# Patient Record
Sex: Male | Born: 1990 | Race: Black or African American | Hispanic: No | Marital: Single | Smoking: Current every day smoker
Health system: Southern US, Community
[De-identification: ages and names within clinical notes are randomized; demographics above are authoritative.]

## PROBLEM LIST (undated history)

## (undated) DIAGNOSIS — J45909 Unspecified asthma, uncomplicated: Secondary | ICD-10-CM

---
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
## 2010-06-18 ENCOUNTER — Emergency Department (HOSPITAL_COMMUNITY): Admission: EM | Admit: 2010-06-18 | Discharge: 2010-06-18 | Payer: Self-pay | Admitting: Emergency Medicine

## 2010-06-26 ENCOUNTER — Emergency Department (HOSPITAL_COMMUNITY): Admission: EM | Admit: 2010-06-26 | Discharge: 2010-06-26 | Payer: Self-pay | Admitting: Family Medicine

## 2011-07-15 ENCOUNTER — Emergency Department (HOSPITAL_COMMUNITY): Payer: Medicaid Other

## 2011-07-15 ENCOUNTER — Emergency Department (HOSPITAL_COMMUNITY)
Admission: EM | Admit: 2011-07-15 | Discharge: 2011-07-15 | Disposition: A | Discharge status: Home / Self Care | Payer: Medicaid Other | Attending: Emergency Medicine | Admitting: Emergency Medicine

## 2011-07-15 DIAGNOSIS — J40 Bronchitis, not specified as acute or chronic: Secondary | ICD-10-CM | POA: Insufficient documentation

## 2011-07-15 DIAGNOSIS — R05 Cough: Secondary | ICD-10-CM | POA: Insufficient documentation

## 2011-07-15 DIAGNOSIS — R0989 Other specified symptoms and signs involving the circulatory and respiratory systems: Secondary | ICD-10-CM | POA: Insufficient documentation

## 2011-07-15 DIAGNOSIS — R1110 Vomiting, unspecified: Secondary | ICD-10-CM | POA: Insufficient documentation

## 2011-07-15 DIAGNOSIS — F17200 Nicotine dependence, unspecified, uncomplicated: Secondary | ICD-10-CM | POA: Insufficient documentation

## 2011-07-15 DIAGNOSIS — R059 Cough, unspecified: Secondary | ICD-10-CM | POA: Insufficient documentation

## 2011-07-15 DIAGNOSIS — R0609 Other forms of dyspnea: Secondary | ICD-10-CM | POA: Insufficient documentation

## 2011-07-15 DIAGNOSIS — R079 Chest pain, unspecified: Secondary | ICD-10-CM | POA: Insufficient documentation

## 2011-07-15 LAB — RAPID STREP SCREEN (MED CTR MEBANE ONLY): Streptococcus, Group A Screen (Direct): NEGATIVE

## 2014-01-31 ENCOUNTER — Encounter (HOSPITAL_COMMUNITY): Payer: Self-pay | Admitting: Emergency Medicine

## 2014-01-31 ENCOUNTER — Emergency Department (HOSPITAL_COMMUNITY)
Admission: EM | Admit: 2014-01-31 | Discharge: 2014-01-31 | Disposition: A | Discharge status: Home / Self Care | Payer: Medicaid Other | Attending: Emergency Medicine | Admitting: Emergency Medicine

## 2014-01-31 DIAGNOSIS — H6691 Otitis media, unspecified, right ear: Secondary | ICD-10-CM

## 2014-01-31 DIAGNOSIS — F17200 Nicotine dependence, unspecified, uncomplicated: Secondary | ICD-10-CM | POA: Insufficient documentation

## 2014-01-31 DIAGNOSIS — J45909 Unspecified asthma, uncomplicated: Secondary | ICD-10-CM | POA: Insufficient documentation

## 2014-01-31 DIAGNOSIS — H6690 Otitis media, unspecified, unspecified ear: Secondary | ICD-10-CM | POA: Insufficient documentation

## 2014-01-31 HISTORY — DX: Unspecified asthma, uncomplicated: J45.909

## 2014-01-31 MED ORDER — AMOXICILLIN 500 MG PO CAPS: 500.0000 mg | ORAL_CAPSULE | Freq: Three times a day (TID) | ORAL | Status: DC

## 2014-01-31 MED ORDER — AMOXICILLIN 500 MG PO CAPS
500.0000 mg | ORAL_CAPSULE | Freq: Once | ORAL | Status: AC
  Administered 2014-01-31: 500 mg via ORAL
  Filled 2014-01-31: qty 1

## 2014-01-31 MED ORDER — ACETAMINOPHEN 500 MG PO TABS: 500.0000 mg | ORAL_TABLET | Freq: Four times a day (QID) | ORAL | Status: AC | PRN

## 2014-01-31 MED ORDER — ACETAMINOPHEN 325 MG PO TABS
650.0000 mg | ORAL_TABLET | Freq: Once | ORAL | Status: AC
  Administered 2014-01-31: 650 mg via ORAL
  Filled 2014-01-31: qty 2

## 2014-01-31 NOTE — Discharge Instructions (Signed)
Take Amoxicillin as directed until gone. Take tylenol as needed for pain. Refer to attached documents for more information.

## 2014-01-31 NOTE — ED Provider Notes (Signed)
CSN: [id number]     Arrival date & time 01/31/14  1235 History   First MD Initiated Contact with Patient 01/31/14 1252     Chief Complaint  Patient presents with  . Otalgia     (Consider location/radiation/quality/duration/timing/severity/associated sxs/prior Treatment) Patient is a 23 y.o. male presenting with ear pain. The history is provided by the patient. No language interpreter was used.  Otalgia Location:  Right Behind ear:  No abnormality Quality:  Aching Severity:  Severe Onset quality:  Gradual Duration:  2 days Timing:  Constant Progression:  Unchanged Chronicity:  New Context: not direct blow, not elevation change and not foreign body in ear   Relieved by:  Nothing Worsened by:  Nothing tried Ineffective treatments:  None tried Associated symptoms: no abdominal pain, no congestion and no cough   Risk factors: no recent travel, no chronic ear infection and no prior ear surgery     Past Medical History  Diagnosis Date  . Asthma    History reviewed. No pertinent past surgical history. History reviewed. No pertinent family history. History  Substance Use Topics  . Smoking status: Current Every Day Smoker  . Smokeless tobacco: Not on file  . Alcohol Use: Yes    Review of Systems  HENT: Positive for ear pain. Negative for congestion.   Respiratory: Negative for cough.   Gastrointestinal: Negative for abdominal pain.      Allergies  Review of patient's allergies indicates no known allergies.  Home Medications  No current outpatient prescriptions on file. BP 104/69  Pulse 96  Temp(Src) 99 F (37.2 C) (Oral)  Resp 18  Ht 5\' 7"  (1.702 m)  Wt 126 lb 8 oz (57.38 kg)  BMI 19.81 kg/m2  SpO2 95% Physical Exam  Nursing note and vitals reviewed. Constitutional: He is oriented to person, place, and time. He appears well-developed and well-nourished. No distress.  HENT:  Head: Normocephalic and atraumatic.  Left Ear: External ear normal.  Mouth/Throat:  Oropharynx is clear and moist. No oropharyngeal exudate.  Right ear tenderness with retraction of auricle and palpation of mastoid. TM intact bilaterally.   Eyes: Conjunctivae and EOM are normal.  Neck: Normal range of motion.  Cardiovascular: Normal rate and regular rhythm.  Exam reveals no gallop and no friction rub.   No murmur heard. Pulmonary/Chest: Effort normal and breath sounds normal. He has no wheezes. He has no rales. He exhibits no tenderness.  Musculoskeletal: Normal range of motion.  Neurological: He is alert and oriented to person, place, and time. Coordination normal.  Speech is goal-oriented. Moves limbs without ataxia.   Skin: Skin is warm and dry.  Psychiatric: He has a normal mood and affect. His behavior is normal.    ED Course  Procedures (including critical care time) Labs Review Labs Reviewed - No data to display Imaging Review No results found.   EKG Interpretation None      MDM   Final diagnoses:  Right otitis media    1:36 PM Patient has right otitis media. Patient will have tylenol and amoxicillin here and be discharged with prescriptions of the same. Vitals stable and patient afebrile.     Emilia BeckKaitlyn Sutton Plake, PA-C 01/31/14 [phone]

## 2014-01-31 NOTE — ED Provider Notes (Signed)
Medical screening examination/treatment/procedure(s) were performed by non-physician practitioner and as supervising physician I was immediately available for consultation/collaboration.  Mahkayla Preece, MD 01/31/14 1608 

## 2014-01-31 NOTE — ED Notes (Signed)
Per pt sts right sided ear pain and facial pain since yesterday.

## 2015-06-03 ENCOUNTER — Emergency Department (HOSPITAL_COMMUNITY)
Admission: EM | Admit: 2015-06-03 | Discharge: 2015-06-03 | Disposition: A | Discharge status: Home / Self Care | Payer: Medicaid Other | Attending: Emergency Medicine | Admitting: Emergency Medicine

## 2015-06-03 ENCOUNTER — Encounter (HOSPITAL_COMMUNITY): Payer: Self-pay | Admitting: Emergency Medicine

## 2015-06-03 ENCOUNTER — Emergency Department (HOSPITAL_COMMUNITY): Payer: Medicaid Other

## 2015-06-03 DIAGNOSIS — Y9289 Other specified places as the place of occurrence of the external cause: Secondary | ICD-10-CM | POA: Insufficient documentation

## 2015-06-03 DIAGNOSIS — W1839XA Other fall on same level, initial encounter: Secondary | ICD-10-CM | POA: Insufficient documentation

## 2015-06-03 DIAGNOSIS — Z720 Tobacco use: Secondary | ICD-10-CM | POA: Insufficient documentation

## 2015-06-03 DIAGNOSIS — Y998 Other external cause status: Secondary | ICD-10-CM | POA: Insufficient documentation

## 2015-06-03 DIAGNOSIS — Y9367 Activity, basketball: Secondary | ICD-10-CM | POA: Insufficient documentation

## 2015-06-03 DIAGNOSIS — Z792 Long term (current) use of antibiotics: Secondary | ICD-10-CM | POA: Insufficient documentation

## 2015-06-03 DIAGNOSIS — S93401A Sprain of unspecified ligament of right ankle, initial encounter: Secondary | ICD-10-CM | POA: Insufficient documentation

## 2015-06-03 DIAGNOSIS — J45909 Unspecified asthma, uncomplicated: Secondary | ICD-10-CM | POA: Insufficient documentation

## 2015-06-03 MED ORDER — HYDROCODONE-ACETAMINOPHEN 5-325 MG PO TABS
2.0000 | ORAL_TABLET | Freq: Once | ORAL | Status: AC
  Administered 2015-06-03: 2 via ORAL
  Filled 2015-06-03: qty 2

## 2015-06-03 MED ORDER — MELOXICAM 15 MG PO TABS: 15.0000 mg | ORAL_TABLET | Freq: Every day | ORAL | Status: DC

## 2015-06-03 NOTE — Discharge Instructions (Signed)
Take mobic as needed for pain. Rest, ice, and elevate your ankle. Refer to attached documents for more information.

## 2015-06-03 NOTE — ED Notes (Signed)
Twisted R ankle and fell on it while playing basketball this evening.  C/o pain and swelling to same.  Ambulatory to triage with crutches.

## 2015-06-03 NOTE — ED Provider Notes (Signed)
CSN: [id number]     Arrival date & time 06/03/15  0013 History   First MD Initiated Contact with Patient 06/03/15 [phone]     Chief Complaint  Patient presents with  . Ankle Pain     (Consider location/radiation/quality/duration/timing/severity/associated sxs/prior Treatment) HPI Comments: Patient is a 24 year old male who presents with right ankle pain that started prior to arrival. The mechanism of injury was sudden ankle inversion. Patient reports sudden onset of throbbing, severe pain that is localized to right ankle. Patient reports progressive worsening of pain. Ankle movement and weight bearing activity make the pain worse. Nothing makes the pain better. Patient reports associated swelling. Patient has not tried anything for pain relief. Patient denies obvious deformity, numbness/tingling, coolness/weakness of extremity, bruising, and any other injury.     Patient is a 24 y.o. male presenting with ankle pain.  Ankle Pain   Past Medical History  Diagnosis Date  . Asthma    History reviewed. No pertinent past surgical history. No family history on file. Social History  Substance Use Topics  . Smoking status: Current Every Day Smoker  . Smokeless tobacco: None  . Alcohol Use: Yes    Review of Systems  Musculoskeletal: Positive for arthralgias.  All other systems reviewed and are negative.     Allergies  Review of patient's allergies indicates no known allergies.  Home Medications   Prior to Admission medications   Medication Sig Start Date End Date Taking? Authorizing Provider  acetaminophen (TYLENOL) 500 MG tablet Take 1 tablet (500 mg total) by mouth every 6 (six) hours as needed. 01/31/14   Ragena Fiola, PA-C  amoxicillin (AMOXIL) 500 MG capsule Take 1 capsule (500 mg total) by mouth 3 (three) times daily. 01/31/14   Mikele Sifuentes, PA-C   BP 122/72 mmHg  Pulse 114  Temp(Src) 98.4 F (36.9 C) (Oral)  Resp 16  SpO2 95% Physical Exam  Constitutional: He  is oriented to person, place, and time. He appears well-developed and well-nourished. No distress.  HENT:  Head: Normocephalic and atraumatic.  Eyes: Conjunctivae and EOM are normal.  Neck: Normal range of motion.  Cardiovascular: Normal rate, regular rhythm and intact distal pulses.  Exam reveals no gallop and no friction rub.   No murmur heard. Pulmonary/Chest: Effort normal and breath sounds normal. He has no wheezes. He has no rales. He exhibits no tenderness.  Abdominal: Soft. He exhibits no distension. There is no tenderness. There is no rebound.  Musculoskeletal:  Limited ROM of right ankle due to pain. No obvious deformity. Right lateral malleolar tenderness to palpation.   Neurological: He is alert and oriented to person, place, and time. Coordination normal.  Speech is goal-oriented. Moves limbs without ataxia.   Skin: Skin is warm and dry.  Psychiatric: He has a normal mood and affect. His behavior is normal.  Nursing note and vitals reviewed.   ED Course  Procedures (including critical care time) Labs Review Labs Reviewed - No data to display  SPLINT APPLICATION Date/Time: 3:23 AM Authorized by: Emilia Beck Consent: Verbal consent obtained. Risks and benefits: risks, benefits and alternatives were discussed Consent given by: patient Splint applied by: nurse Location details: right ankle Splint type: ASO brace Supplies used: ASO brace Post-procedure: The splinted body part was neurovascularly unchanged following the procedure. Patient tolerance: Patient tolerated the procedure well with no immediate complications.     Imaging Review Dg Ankle Complete Right  06/03/2015   CLINICAL DATA:  Twisted right ankle while playing basketball.  Lateral ankle pain.  EXAM: RIGHT ANKLE - COMPLETE 3+ VIEW  COMPARISON:  Right ankle radiographs 06/09/2007  FINDINGS: There is no evidence of fracture, dislocation, or joint effusion. There is no evidence of arthropathy or other  focal bone abnormality. Soft tissues are unremarkable.  IMPRESSION: Negative right ankle radiographs.   Electronically Signed   By: Marin Roberts M.D.   On: 06/03/2015 00:54   I, Emilia Beck, personally reviewed and evaluated these images and lab results as part of my medical decision-making.   EKG Interpretation None      MDM   Final diagnoses:  Right ankle sprain, initial encounter    3:21 AM Patient's xray shows no acute changes. Patient has a right ankle sprain. Patient will have ASO brace and mobic for symptoms. Patient instructed to rest, ice, and elevate. No other injury.     Emilia Beck, PA-C 06/03/15 0325  Layla Maw Ward, DO 06/03/15 1610

## 2015-06-03 NOTE — ED Notes (Signed)
Pt left with all belongings and was wheeled out to the waiting room.  

## 2015-08-30 ENCOUNTER — Encounter (HOSPITAL_COMMUNITY): Payer: Self-pay | Admitting: Emergency Medicine

## 2015-08-30 ENCOUNTER — Emergency Department (HOSPITAL_COMMUNITY): Payer: Medicaid Other

## 2015-08-30 ENCOUNTER — Emergency Department (HOSPITAL_COMMUNITY)
Admission: EM | Admit: 2015-08-30 | Discharge: 2015-08-30 | Disposition: A | Discharge status: Home / Self Care | Payer: Medicaid Other | Attending: Physician Assistant | Admitting: Physician Assistant

## 2015-08-30 DIAGNOSIS — Y999 Unspecified external cause status: Secondary | ICD-10-CM | POA: Insufficient documentation

## 2015-08-30 DIAGNOSIS — Z791 Long term (current) use of non-steroidal anti-inflammatories (NSAID): Secondary | ICD-10-CM | POA: Insufficient documentation

## 2015-08-30 DIAGNOSIS — Z720 Tobacco use: Secondary | ICD-10-CM | POA: Insufficient documentation

## 2015-08-30 DIAGNOSIS — Y9389 Activity, other specified: Secondary | ICD-10-CM | POA: Insufficient documentation

## 2015-08-30 DIAGNOSIS — W010XXA Fall on same level from slipping, tripping and stumbling without subsequent striking against object, initial encounter: Secondary | ICD-10-CM | POA: Insufficient documentation

## 2015-08-30 DIAGNOSIS — J45909 Unspecified asthma, uncomplicated: Secondary | ICD-10-CM | POA: Insufficient documentation

## 2015-08-30 DIAGNOSIS — S4991XA Unspecified injury of right shoulder and upper arm, initial encounter: Secondary | ICD-10-CM | POA: Insufficient documentation

## 2015-08-30 DIAGNOSIS — Z792 Long term (current) use of antibiotics: Secondary | ICD-10-CM | POA: Insufficient documentation

## 2015-08-30 DIAGNOSIS — M25511 Pain in right shoulder: Secondary | ICD-10-CM

## 2015-08-30 DIAGNOSIS — Y9289 Other specified places as the place of occurrence of the external cause: Secondary | ICD-10-CM | POA: Insufficient documentation

## 2015-08-30 MED ORDER — IBUPROFEN 400 MG PO TABS
800.0000 mg | ORAL_TABLET | Freq: Once | ORAL | Status: AC
  Administered 2015-08-30: 800 mg via ORAL
  Filled 2015-08-30: qty 2

## 2015-08-30 MED ORDER — IBUPROFEN 800 MG PO TABS
800.0000 mg | ORAL_TABLET | Freq: Three times a day (TID) | ORAL | Status: DC
Start: 2015-08-30 — End: 2017-05-10

## 2015-08-30 NOTE — ED Notes (Signed)
PT NEEDS AN INHALER. HX OF ASTHMA.

## 2015-08-30 NOTE — ED Provider Notes (Signed)
CSN: [id number]     Arrival date & time 08/30/15  1029 History  By signing my name below, I, Tanda RockersMargaux Venter, attest that this documentation has been prepared under the direction and in the presence of Cheri FowlerKayla Anchor Dwan, PA-C. Electronically Signed: Tanda RockersMargaux Venter, ED Scribe. 08/30/2015. 12:02 PM.  Chief Complaint  Patient presents with  . Shoulder Injury   The history is provided by the patient. No language interpreter was used.     HPI Comments: Edward Ayers is a 24 y.o. male who is right hand dominant presents to the Emergency Department complaining of sudden onset, constant, right shoulder pain that began last night. Pt was playing with his kid when he tripped and fell, landing onto his right shoulder. Pt states he heard a loud popping noise when he fell. He notes intermittent numbness and sharp pain to the area. The pain is exacerbated with movement. There are no alleviating factors. He has not taken any pain medication for him symptoms. Denies tingling, weakness, or any other associated symptoms.    Past Medical History  Diagnosis Date  . Asthma    History reviewed. No pertinent past surgical history. No family history on file. Social History  Substance Use Topics  . Smoking status: Current Every Day Smoker  . Smokeless tobacco: None  . Alcohol Use: Yes    Review of Systems  All other systems reviewed and are negative.     Allergies  Review of patient's allergies indicates no known allergies.  Home Medications   Prior to Admission medications   Medication Sig Start Date End Date Taking? Authorizing Provider  acetaminophen (TYLENOL) 500 MG tablet Take 1 tablet (500 mg total) by mouth every 6 (six) hours as needed. 01/31/14   Kaitlyn Szekalski, PA-C  amoxicillin (AMOXIL) 500 MG capsule Take 1 capsule (500 mg total) by mouth 3 (three) times daily. 01/31/14   Kaitlyn Szekalski, PA-C  ibuprofen (ADVIL,MOTRIN) 800 MG tablet Take 1 tablet (800 mg total) by mouth 3 (three) times  daily. 08/30/15   Cheri FowlerKayla Agamjot Kilgallon, PA-C  meloxicam (MOBIC) 15 MG tablet Take 1 tablet (15 mg total) by mouth daily. 06/03/15   Emilia BeckKaitlyn Szekalski, PA-C   Triage Vitals: BP 108/70 mmHg  Pulse 72  Temp(Src) 98 F (36.7 C)  Resp 16  Ht 5\' 7"  (1.702 m)  Wt 145 lb (65.772 kg)  BMI 22.71 kg/m2  SpO2 100%   Physical Exam  Constitutional: He is oriented to person, place, and time. He appears well-developed and well-nourished. No distress.  HENT:  Head: Normocephalic and atraumatic.  Eyes: Conjunctivae and EOM are normal.  Neck: Neck supple. No tracheal deviation present.  Cardiovascular: Normal rate, regular rhythm and normal heart sounds.   Pulses:      Radial pulses are 2+ on the right side, and 2+ on the left side.  Pulmonary/Chest: Effort normal and breath sounds normal. No respiratory distress. He has no wheezes. He has no rales.  Abdominal: Soft. Bowel sounds are normal. He exhibits no distension.  Musculoskeletal: Normal range of motion.  Right shoulder TTP along AC joint line and trapezius. No tenderness or tenting along right clavicle. TTP along insertion of biceps tendon. Minimal swelling.  No erythema or signs of infection. FROM of right shoulder. 5/5 strength in right shoulder. Pain with resisted supination and pronation.  Neurological: He is alert and oriented to person, place, and time.  Skin: Skin is warm and dry.  Psychiatric: He has a normal mood and affect. His behavior is normal.  Nursing note and vitals reviewed.   ED Course  Procedures (including critical care time)  DIAGNOSTIC STUDIES: Oxygen Saturation is 100% on RA, normal by my interpretation.    COORDINATION OF CARE: 12:00 PM-Discussed treatment plan which includes Rx muscle relaxant and Motrin and referral for orthopedist with pt at bedside and pt agreed to plan.   Labs Review Labs Reviewed - No data to display  Imaging Review Dg Shoulder Right  08/30/2015  CLINICAL DATA:  Initial encounter for Pt tripped  and fell right shoulder first into ground - has been having anterior and superior right shoulder pain since, has a hard time lifting arm up and out well EXAM: RIGHT SHOULDER - 2+ VIEW COMPARISON:  None. FINDINGS: No acute fracture or dislocation. Visualized portion of the right hemithorax is normal. IMPRESSION: No acute osseous abnormality. Electronically Signed   By: Jeronimo Greaves M.D.   On: 08/30/2015 11:46   I have personally reviewed and evaluated these images as part of my medical decision-making.   EKG Interpretation None      MDM   Final diagnoses:  Right shoulder pain   Patient presents with right shoulder pain after sustaining a fall last night.  VSS, NAD.  Neurovascularly intact.  No evidence of fracture or dislocation.  Concern for ligamentous injury.  Ortho follow up and arm sling given.  Will give Motrin for pain.  Evaluation does not show pathology requring ongoing emergent intervention or admission. Pt is hemodynamically stable and mentating appropriately. Discussed findings/results and plan with patient/guardian, who agrees with plan. All questions answered. Return precautions discussed and outpatient follow up given.   I personally performed the services described in this documentation, which was scribed in my presence. The recorded information has been reviewed and is accurate.      Cheri Fowler, PA-C 08/30/15 1213  Courteney Lyn Corlis Leak, MD 08/30/15 [phone]

## 2015-08-30 NOTE — Discharge Instructions (Signed)
Shoulder Pain The shoulder is the joint that connects your arms to your body. The bones that form the shoulder joint include the upper arm bone (humerus), the shoulder blade (scapula), and the collarbone (clavicle). The top of the humerus is shaped like a ball and fits into a rather flat socket on the scapula (glenoid cavity). A combination of muscles and strong, fibrous tissues that connect muscles to bones (tendons) support your shoulder joint and hold the ball in the socket. Small, fluid-filled sacs (bursae) are located in different areas of the joint. They act as cushions between the bones and the overlying soft tissues and help reduce friction between the gliding tendons and the bone as you move your arm. Your shoulder joint allows a wide range of motion in your arm. This range of motion allows you to do things like scratch your back or throw a ball. However, this range of motion also makes your shoulder more prone to pain from overuse and injury. Causes of shoulder pain can originate from both injury and overuse and usually can be grouped in the following four categories:  Redness, swelling, and pain (inflammation) of the tendon (tendinitis) or the bursae (bursitis).  Instability, such as a dislocation of the joint.  Inflammation of the joint (arthritis).  Broken bone (fracture). HOME CARE INSTRUCTIONS   Apply ice to the sore area.  Put ice in a plastic bag.  Place a towel between your skin and the bag.  Leave the ice on for 15-20 minutes, 3-4 times per day for the first 2 days, or as directed by your health care provider.  Stop using cold packs if they do not help with the pain.  If you have a shoulder sling or immobilizer, wear it as long as your caregiver instructs. Only remove it to shower or bathe. Move your arm as little as possible, but keep your hand moving to prevent swelling.  Squeeze a soft ball or foam pad as much as possible to help prevent swelling.  Only take  over-the-counter or prescription medicines for pain, discomfort, or fever as directed by your caregiver. SEEK MEDICAL CARE IF:   Your shoulder pain increases, or new pain develops in your arm, hand, or fingers.  Your hand or fingers become cold and numb.  Your pain is not relieved with medicines. SEEK IMMEDIATE MEDICAL CARE IF:   Your arm, hand, or fingers are numb or tingling.  Your arm, hand, or fingers are significantly swollen or turn white or blue. MAKE SURE YOU:   Understand these instructions.  Will watch your condition.  Will get help right away if you are not doing well or get worse.   This information is not intended to replace advice given to you by your health care provider. Make sure you discuss any questions you have with your health care provider.   Document Released: 07/17/2005 Document Revised: 10/28/2014 Document Reviewed: 01/30/2015 Elsevier Interactive Patient Education 2016 [employer].   Emergency Department Resource Guide 1) Find a Doctor and Pay Out of Pocket Although you won't have to find out who is covered by your insurance plan, it is a good idea to ask around and get recommendations. You will then need to call the office and see if the doctor you have chosen will accept you as a new patient and what types of options they offer for patients who are self-pay. Some doctors offer discounts or will set up payment plans for their patients who do not have insurance, but you  will need to ask so you aren't surprised when you get to your appointment.  2) Contact Your Local Health Department Not all health departments have doctors that can see patients for sick visits, but many do, so it is worth a call to see if yours does. If you don't know where your local health department is, you can check in your phone book. The CDC also has a tool to help you locate your state's health department, and many state websites also have listings of all of their local health  departments.  3) Find a Walk-in Clinic If your illness is not likely to be very severe or complicated, you may want to try a walk in clinic. These are popping up all over the country in pharmacies, drugstores, and shopping centers. They're usually staffed by nurse practitioners or physician assistants that have been trained to treat common illnesses and complaints. They're usually fairly quick and inexpensive. However, if you have serious medical issues or chronic medical problems, these are probably not your best option.  No Primary Care Doctor: - Call Health Connect at  [phone] - they can help you locate a primary care doctor that  accepts your insurance, provides certain services, etc. - Physician Referral Service- [phone]  Chronic Pain Problems: Organization         Address  Phone   Notes  [facility]  [phone] Patients need to be referred by their primary care doctor.   Medication Assistance: Organization         Address  Phone   Notes  [facility] [address] [place], [place] [id number] [phone] --Must be a resident of [place] -- Must have NO insurance coverage whatsoever (no Medicaid/ Medicare, etc.) -- The pt. MUST have a primary care doctor that directs their care regularly and follows them in the community   [facility]  [phone]   [employer]  [phone]    Agencies that provide inexpensive medical care: Organization         Address  Phone   Notes  [facility]  [phone]   [facility]    [phone]   [facility] [address] [place], [place] [address] [phone]   [facility] [address], [place] [phone]   [facility]    [phone]   [facility]    [phone]   [facility]  [address], [place] Phone:  [phone](336)  [phone], Fax:  [phone] Hours of Operation:  9 am - 6 pm, M-F.  Also accepts Medicaid/Medicare and self-pay.  [facility]  [address], [place] Phone: [phone], Fax: [phone]. Hours of Operation:  8:30 am - 5:30 pm, M-F.  Also accepts Medicaid and self-pay.  [facility] [place] [address], [place] Phone: [phone]) [phone]   [facility] [address], [place], [place] [phone], Ext. [phone] Mondays & Thursdays: 7-9 AM.  First 15 patients are seen on a first come, first serve basis.    Medicaid-accepting [facility] Providers:  Organization         Address  Phone   Notes  [facility] [address], [place] [phone] Also accepts self-pay patients.  [facility] [address], [place] [address],  [place] ° [phone]   °[facility] [address], [place] [phone]) [phone]   °[facility] [address] [place] [address], [place] [phone]) [phone]   °Veita Bland [address], [place]  ° [phone] Only accepts [place] Access Medicaid patients after they have their name applied to their card.  ° °Self-Pay (no insurance) in [place]: ° °Organization         Address  Phone   Notes  °Sickle Cell Patients, [facility] [address], [place] [phone]   °[facility] [address], [place] [phone]) [phone]   °[facility] ° [address], [place] [phone]) [phone]   °[facility]/Dr. Osei-Bonsu ° [address] [place] [address], [place] or [address], [place] [phone] Phone number for both [place] and [place] locations is the same.  °[facility] [address], [place] [phone]   °[facility] [address] [place] [address], [place] or [address] [phone]  [phone] °([phone]) [phone]   °[facility] [address], [place] ([phone]) [phone], phone; [phone], fax Sees patients 1st and 3rd Saturday of every month.  Must not qualify for public or private insurance (i.e. Medicaid, Medicare, [place] Health Choice, Veterans' Benefits) • Household income should be no more than 200% of the poverty level •The clinic cannot treat you if you are pregnant or think you are pregnant • Sexually transmitted diseases are not treated at the clinic.  ° ° °Dental Care: °Organization         Address  Phone  Notes  °[place] [facility] [address], [place] [phone]) [phone] Accepts children up to age 21 who are enrolled in Medicaid or [place] Health Choice; pregnant women with a Medicaid card; and children who have applied for Medicaid or [place] Health Choice, but were declined, whose parents can pay a reduced fee at time of service.  °[place] [facility] [place]  [address], [place] [phone]) [phone] Accepts children up to age 21 who are enrolled in Medicaid or [place] Health Choice; pregnant women with a Medicaid card; and children who have applied for Medicaid or [place] Health Choice, but were declined, whose parents can pay a reduced fee at time of service.  °[facility] ° [address], [place] [phone]) [phone] Patients are seen by appointment only. Walk-ins are not accepted. [facility] will see patients 18 years of age and older. °Monday - Tuesday (8am-5pm) °Most Wednesdays (8:30-5pm) °$30 per visit, cash only  °[facility] ° [address], [place] [phone]) [phone] Patients are seen by appointment only. Walk-ins are not accepted. [facility] will see patients 18 years of age and older. °One Wednesday Evening (Monthly: Volunteer Based).  $30 per visit, cash only  °[facility]  [phone] for adults;  Children under age 4, call Graduate Pediatric Dentistry at [phone]. Children aged 4-14, please call [phone] to request a pediatric application. ° Dental services are provided in all areas of dental care including fillings, crowns and bridges, complete and partial dentures, implants, gum treatment, root canals, and extractions. Preventive care is also provided. Treatment is provided to both adults and children. °Patients are selected via a lottery and   there is often a waiting list. °  °[facility] [address], °[place] ° ([phone]) [phone] [email or web address] °  °[facility] [address], [place], [place] [phone], Ext. [phone] Second and Fourth Thursday of each month, opens at 6:30 AM; Clinic ends at 9 AM.  Patients are seen on a first-come first-served basis, and a limited number are seen during each clinic.  ° °[facility] ° [address], [place], [place] [phone]) [phone]   Eligibility Requirements °You must have lived in [place], [place], or [place] counties for at least the last three months. °  You cannot be eligible for state or federal sponsored healthcare insurance, including Veterans Administration, Medicaid, or Medicare. °  You generally cannot be eligible for healthcare insurance through your employer.  °  How to apply: °Eligibility screenings are held every Tuesday and Wednesday afternoon from 1:00 pm until 4:00 pm. You do not need an appointment for the interview!  °[facility] [address], [place], [place] [phone]   °[facility]  336-342-8273   °[facility]  336-703-3100   °[facility]  336-570-6415   ° °Behavioral Health Resources in the Community: °Intensive Outpatient Programs °Organization         Address  Phone  Notes  °[place] [facility] [address], [place], [place] [phone]   °[facility] [address], [place], [place] [phone]   °[facility] [address], [place], [place] ° 336-882-2125   °[facility] [address],  °[place], [place] 1-800-853-5163 or 336-641-4981   °Substance Abuse Resources °Organization         Address  Phone  Notes  °[facility]  336-882-2125   °[facility]  336-784-9470   °[facility]  336-285-9073   °[facility]  336-845-3988   °Residential & Outpatient Substance Abuse Program  1-800-659-3381   °Psychological Services °Organization         Address  Phone  Notes  °[facility]  336- 832-9600   °[facility]  336- 378-7881   °[facility] [address], [place] 1-800-853-5163 or 336-641-4981   ° °Mobile Crisis Teams °Organization         Address  Phone  Notes  °[facility], Mobile Crisis Care Unit  1-877-626-1772   °Assertive °[facility] ° [address] [place], [place] 336-834-9664   °Sharon DeEsch [address] °[place] 336-554-5454   ° °Self-Help/Support Groups °Organization         Address  Phone             Notes  °[facility] [place] - variety of support groups  336- 373-1402 Call for more information  °Narcotics Anonymous (NA), [facility] [address], °[place]  2 meetings at this location  ° °Residential Treatment Programs °Organization         Address  Phone  Notes  °[facility] [address],    °[place]  1-866-801-8205   °[facility] ° [address], [place], [place] 704-293-8524   °[facility] [address], [place] 336-845-3988 Admissions: 8am-3pm M-F  °[facility] [address]    °[place], [place] 336-841-1104   °[facility] [address], [place], [place] 336-379-7146   °  [facility] [address],  [place], [place] [phone]   [facility] - Intensive  Outpatient [address], [place], [place] [phone]   [facility] ([facility] [address] [place]  [place], [place] 0-865-784-6962 or ([phone])[phone]   [facility] ([facility] [address], [place], [place] [phone] Accepts Medicaid  [facility] [place] [address]  [place] 4-403-474-2595 Substance Abuse/Addiction Treatment   [facility] Organization         Address  Phone  Notes  [facility]  305-180-5320   Angie Fava, PhD [address] [place], [place]   310-517-2464 or (978)[phone]   [facility]   [address] [place], [place] [phone]   [facility] [address], [place], [place] [phone] Insurance/Medicaid/sponsorship through [facility] [address]                                    [place], [place] [phone] Therapy/tele-psych/case  [facility] [address], [place] [phone]    Dr. Lolly Mustache  [phone]   [facility]  [employer] [facility] 1) [address], [place] 2) [address], [place] 3)  [address], [place] [phone]  [phone]   [facility] Child Abuse Hotline 253-490-5606 or [phone] (After Hours)

## 2015-08-30 NOTE — ED Notes (Signed)
Fell on right shoulder last pm. Unable to move arm today.

## 2015-09-05 ENCOUNTER — Encounter (HOSPITAL_COMMUNITY): Payer: Self-pay | Admitting: Emergency Medicine

## 2015-09-05 ENCOUNTER — Emergency Department (HOSPITAL_COMMUNITY)
Admission: EM | Admit: 2015-09-05 | Discharge: 2015-09-05 | Disposition: A | Discharge status: Home / Self Care | Payer: Self-pay | Attending: Emergency Medicine | Admitting: Emergency Medicine

## 2015-09-05 ENCOUNTER — Emergency Department (HOSPITAL_COMMUNITY): Payer: Medicaid Other

## 2015-09-05 DIAGNOSIS — S46011A Strain of muscle(s) and tendon(s) of the rotator cuff of right shoulder, initial encounter: Secondary | ICD-10-CM | POA: Insufficient documentation

## 2015-09-05 DIAGNOSIS — Y9389 Activity, other specified: Secondary | ICD-10-CM | POA: Insufficient documentation

## 2015-09-05 DIAGNOSIS — F17200 Nicotine dependence, unspecified, uncomplicated: Secondary | ICD-10-CM | POA: Insufficient documentation

## 2015-09-05 DIAGNOSIS — Y9289 Other specified places as the place of occurrence of the external cause: Secondary | ICD-10-CM | POA: Insufficient documentation

## 2015-09-05 DIAGNOSIS — Z791 Long term (current) use of non-steroidal anti-inflammatories (NSAID): Secondary | ICD-10-CM | POA: Insufficient documentation

## 2015-09-05 DIAGNOSIS — Y999 Unspecified external cause status: Secondary | ICD-10-CM | POA: Insufficient documentation

## 2015-09-05 DIAGNOSIS — X58XXXA Exposure to other specified factors, initial encounter: Secondary | ICD-10-CM | POA: Insufficient documentation

## 2015-09-05 DIAGNOSIS — J45909 Unspecified asthma, uncomplicated: Secondary | ICD-10-CM | POA: Insufficient documentation

## 2015-09-05 MED ORDER — DICLOFENAC SODIUM 1 % TD GEL: 2.0000 g | Freq: Four times a day (QID) | TRANSDERMAL | Status: AC

## 2015-09-05 MED ORDER — NAPROXEN 250 MG PO TABS
500.0000 mg | ORAL_TABLET | Freq: Once | ORAL | Status: AC
  Administered 2015-09-05: 500 mg via ORAL

## 2015-09-05 MED ORDER — HYDROCODONE-ACETAMINOPHEN 5-325 MG PO TABS: 1.0000 | ORAL_TABLET | Freq: Four times a day (QID) | ORAL | Status: AC | PRN

## 2015-09-05 MED ORDER — DIAZEPAM 5 MG PO TABS: 5.0000 mg | ORAL_TABLET | Freq: Two times a day (BID) | ORAL | Status: AC

## 2015-09-05 NOTE — Discharge Instructions (Signed)
Apply voltaren as prescribed and alternate ice and heat 3-4 times per day for 15-20 minutes each time. Take Valium for spasm and Norco as needed for pain. Follow up with sports medicine.  Rotator Cuff Injury Rotator cuff injury is any type of injury to the set of muscles and tendons that make up the stabilizing unit of your shoulder. This unit holds the ball of your upper arm bone (humerus) in the socket of your shoulder blade (scapula).  CAUSES Injuries to your rotator cuff most commonly come from sports or activities that cause your arm to be moved repeatedly over your head. Examples of this include throwing, weight lifting, swimming, or racquet sports. Long lasting (chronic) irritation of your rotator cuff can cause soreness and swelling (inflammation), bursitis, and eventual damage to your tendons, such as a tear (rupture). SIGNS AND SYMPTOMS Acute rotator cuff tear:  Sudden tearing sensation followed by severe pain shooting from your upper shoulder down your arm toward your elbow.  Decreased range of motion of your shoulder because of pain and muscle spasm.  Severe pain.  Inability to raise your arm out to the side because of pain and loss of muscle power (large tears). Chronic rotator cuff tear:  Pain that usually is worse at night and may interfere with sleep.  Gradual weakness and decreased shoulder motion as the pain worsens.  Decreased range of motion. Rotator cuff tendinitis:  Deep ache in your shoulder and the outside upper arm over your shoulder.  Pain that comes on gradually and becomes worse when lifting your arm to the side or turning it inward. DIAGNOSIS Rotator cuff injury is diagnosed through a medical history, physical exam, and imaging exam. The medical history helps determine the type of rotator cuff injury. Your health care provider will look at your injured shoulder, feel the injured area, and ask you to move your shoulder in different positions. X-ray exams  typically are done to rule out other causes of shoulder pain, such as fractures. MRI is the exam of choice for the most severe shoulder injuries because the images show muscles and tendons.  TREATMENT  Chronic tear:  Medicine for pain, such as acetaminophen or ibuprofen.  Physical therapy and range-of-motion exercises may be helpful in maintaining shoulder function and strength.  Steroid injections into your shoulder joint.  Surgical repair of the rotator cuff if the injury does not heal with noninvasive treatment. Acute tear:  Anti-inflammatory medicines such as ibuprofen and naproxen to help reduce pain and swelling.  A sling to help support your arm and rest your rotator cuff muscles. Long-term use of a sling is not advised. It may cause significant stiffening of the shoulder joint.  Surgery may be considered within a few weeks, especially in younger, active people, to return the shoulder to full function.  Indications for surgical treatment include the following:  Age younger than 60 years.  Rotator cuff tears that are complete.  Physical therapy, rest, and anti-inflammatory medicines have been used for 6-8 weeks, with no improvement.  Employment or sporting activity that requires constant shoulder use. Tendinitis:  Anti-inflammatory medicines such as ibuprofen and naproxen to help reduce pain and swelling.  A sling to help support your arm and rest your rotator cuff muscles. Long-term use of a sling is not advised. It may cause significant stiffening of the shoulder joint.  Severe tendinitis may require:  Steroid injections into your shoulder joint.  Physical therapy.  Surgery. HOME CARE INSTRUCTIONS   Apply ice to your  injury:  Put ice in a plastic bag.  Place a towel between your skin and the bag.  Leave the ice on for 20 minutes, 2-3 times a day.  If you have a shoulder immobilizer (sling and straps), wear it until told otherwise by your health care  provider.  You may want to sleep on several pillows or in a recliner at night to lessen swelling and pain.  Only take over-the-counter or prescription medicines for pain, discomfort, or fever as directed by your health care provider.  Do simple hand squeezing exercises with a soft rubber ball to decrease hand swelling. SEEK MEDICAL CARE IF:   Your shoulder pain increases, or new pain or numbness develops in your arm, hand, or fingers.  Your hand or fingers are colder than your other hand. SEEK IMMEDIATE MEDICAL CARE IF:   Your arm, hand, or fingers are numb or tingling.  Your arm, hand, or fingers are increasingly swollen and painful, or they turn white or blue. MAKE SURE YOU:  Understand these instructions.  Will watch your condition.  Will get help right away if you are not doing well or get worse.   This information is not intended to replace advice given to you by your health care provider. Make sure you discuss any questions you have with your health care provider.   Document Released: 10/04/2000 Document Revised: 10/12/2013 Document Reviewed: 05/19/2013 Elsevier Interactive Patient Education [employer]

## 2015-09-05 NOTE — ED Notes (Signed)
Pt. reports right shoulder pain with mild swelling  onset today while heavy lifting at work " felt a pop" , pain increases with movement .

## 2015-09-05 NOTE — ED Notes (Signed)
Pt left with all his belongings and ambulated out of the treatment area.  

## 2015-09-05 NOTE — ED Provider Notes (Signed)
CSN: [id number]     Arrival date & time 09/05/15  0040 History   First MD Initiated Contact with Patient 09/05/15 0140     Chief Complaint  Patient presents with  . Shoulder Injury     (Consider location/radiation/quality/duration/timing/severity/associated sxs/prior Treatment) HPI Comments: Patient is a 24 year old male with no significant past medical history. He is right-hand-dominant and presented to the emergency department 5 days ago after a fall on his right shoulder. He presents today for persistent right shoulder pain. He reports that he was lifting a heavy bag of trash with his right arm when he "felt a pop" in his right shoulder followed by onset of worsening pain. He reports that he has been taking ibuprofen for management of his shoulder pain since his prior ED visit, but this has been providing little relief. Patient denies any extremity numbness or tingling. He denies any direct trauma or injury to his shoulder today. No recent fevers or history of shoulder dislocations. Pain is exacerbated with movement.  Patient is a 24 y.o. male presenting with shoulder injury. The history is provided by the patient. No language interpreter was used.  Shoulder Injury Associated symptoms include arthralgias. Pertinent negatives include no nausea, numbness, vomiting or weakness.    Past Medical History  Diagnosis Date  . Asthma    History reviewed. No pertinent past surgical history. No family history on file. Social History  Substance Use Topics  . Smoking status: Current Every Day Smoker  . Smokeless tobacco: None  . Alcohol Use: Yes    Review of Systems  Gastrointestinal: Negative for nausea and vomiting.  Musculoskeletal: Positive for arthralgias.  Neurological: Negative for weakness and numbness.  All other systems reviewed and are negative.   Allergies  Review of patient's allergies indicates no known allergies.  Home Medications   Prior to Admission medications    Medication Sig Start Date End Date Taking? Authorizing Provider  acetaminophen (TYLENOL) 500 MG tablet Take 1 tablet (500 mg total) by mouth every 6 (six) hours as needed. Patient taking differently: Take 1,000 mg by mouth every 6 (six) hours as needed for mild pain.  01/31/14  Yes Kaitlyn Szekalski, PA-C  ibuprofen (ADVIL,MOTRIN) 800 MG tablet Take 1 tablet (800 mg total) by mouth 3 (three) times daily. 08/30/15  Yes Kayla Rose, PA-C  diazepam (VALIUM) 5 MG tablet Take 1 tablet (5 mg total) by mouth 2 (two) times daily. 09/05/15   Antony Madura, PA-C  diclofenac sodium (VOLTAREN) 1 % GEL Apply 2 g topically 4 (four) times daily. Rub into your right shoulder 09/05/15   Antony Madura, PA-C  HYDROcodone-acetaminophen (NORCO/VICODIN) 5-325 MG tablet Take 1-2 tablets by mouth every 6 (six) hours as needed. 09/05/15   Antony Madura, PA-C   BP 121/88 mmHg  Pulse 70  Temp(Src) 97.4 F (36.3 C) (Oral)  Resp 18  Ht  (1.702 m)  Wt 132 lb 3 oz (59.96 kg)  BMI 20.70 kg/m2  SpO2 100%   Physical Exam  Constitutional: He is oriented to person, place, and time. He appears well-developed and well-nourished. No distress.  Nontoxic/nonseptic appearing  HENT:  Head: Normocephalic and atraumatic.  Eyes: Conjunctivae and EOM are normal. No scleral icterus.  Neck: Normal range of motion.  Cardiovascular: Normal rate, regular rhythm and intact distal pulses.   Distal radial pulse 2+ in the right upper extremity  Pulmonary/Chest: Effort normal. No respiratory distress.  Respirations even and unlabored  Musculoskeletal:       Right  shoulder: He exhibits decreased range of motion, tenderness and pain. He exhibits no swelling, no crepitus, no deformity and normal pulse.       Cervical back: Normal.       Right upper arm: Normal.  Decreased active range of motion of the right shoulder secondary to pain with movement past 90. There is generalized tenderness to the right shoulder without bony deformity or  crepitus. No appreciable spasms. No evidence of trauma such as abrasion, ecchymosis, or effusion.  Neurological: He is alert and oriented to person, place, and time. He exhibits normal muscle tone. Coordination normal.  Sensation to light touch intact in all extremities. Grip strength 5/5 bilaterally.  Skin: Skin is warm and dry. No rash noted. He is not diaphoretic. No erythema. No pallor.  Psychiatric: He has a normal mood and affect. His behavior is normal.  Nursing note and vitals reviewed.   ED Course  Procedures (including critical care time) Labs Review Labs Reviewed - No data to display  Imaging Review Dg Shoulder Right  09/05/2015  CLINICAL DATA:  Right shoulder pain after injury 3 days prior. Re-injured shoulder since that time. EXAM: RIGHT SHOULDER - 2+ VIEW COMPARISON:  Shoulder radiograph 08/30/2015 FINDINGS: No fracture or dislocation. The alignment and joint spaces are maintained. No abnormal soft tissue calcifications. IMPRESSION: Negative right shoulder radiographs. Electronically Signed   By: Rubye OaksMelanie  Ehinger M.D.   On: 09/05/2015 01:18   I have personally reviewed and evaluated these images and lab results as part of my medical decision-making.   EKG Interpretation None      MDM   Final diagnoses:  Rotator cuff strain, right, initial encounter    24 year old male presents to the emergency department for evaluation of right shoulder pain after lifting a bag of heavy trash. He is neurovascularly intact and without bony deformity or crepitus. X-ray shows no fracture, dislocation, or bony deformity. Symptoms consistent with injury to rotator cuff and likely spasm given decreased active range of motion. Have advised the patient on frequent stretching to prevent frozen shoulder will manage with Voltaren gel, Valium, and Norco when necessary. Patient referred to sports medicine for persistent symptoms. Return precautions given at discharge. Patient agreeable to plan with no  unaddressed concerns. Patient discharged in good condition.   Filed Vitals:   09/05/15 0047 09/05/15 0200  BP: 108/71 121/88  Pulse: 76 70  Temp: 97.4 F (36.3 C)   TempSrc: Oral   Resp: 18   Height: 5\' 7"  (1.702 m)   Weight: 132 lb 3 oz (59.96 kg)   SpO2: 100% 100%      Antony MaduraKelly Kiala Faraj, PA-C 09/05/15 0253  Dione Boozeavid Glick, MD 09/05/15 [phone]

## 2016-02-16 IMAGING — DX DG ANKLE COMPLETE 3+V*R*
3 series · 3 of 3 positions shown · non-contrast
Comparison: Right ankle radiographs 06/09/2007

CLINICAL DATA: Twisted right ankle while playing basketball.
Lateral ankle pain.

EXAM:
RIGHT ANKLE - COMPLETE 3+ VIEW

[ankle ap]
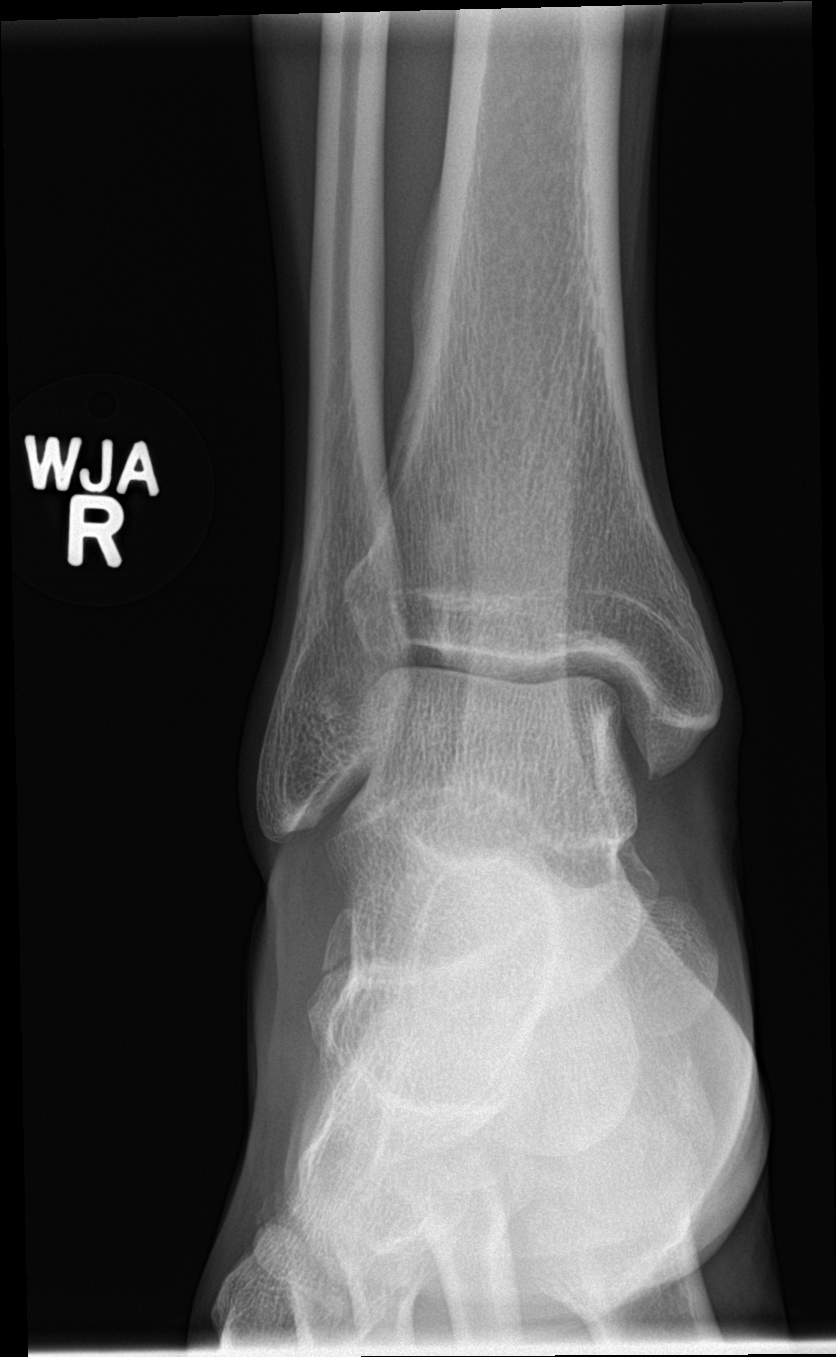

[ankle obl]
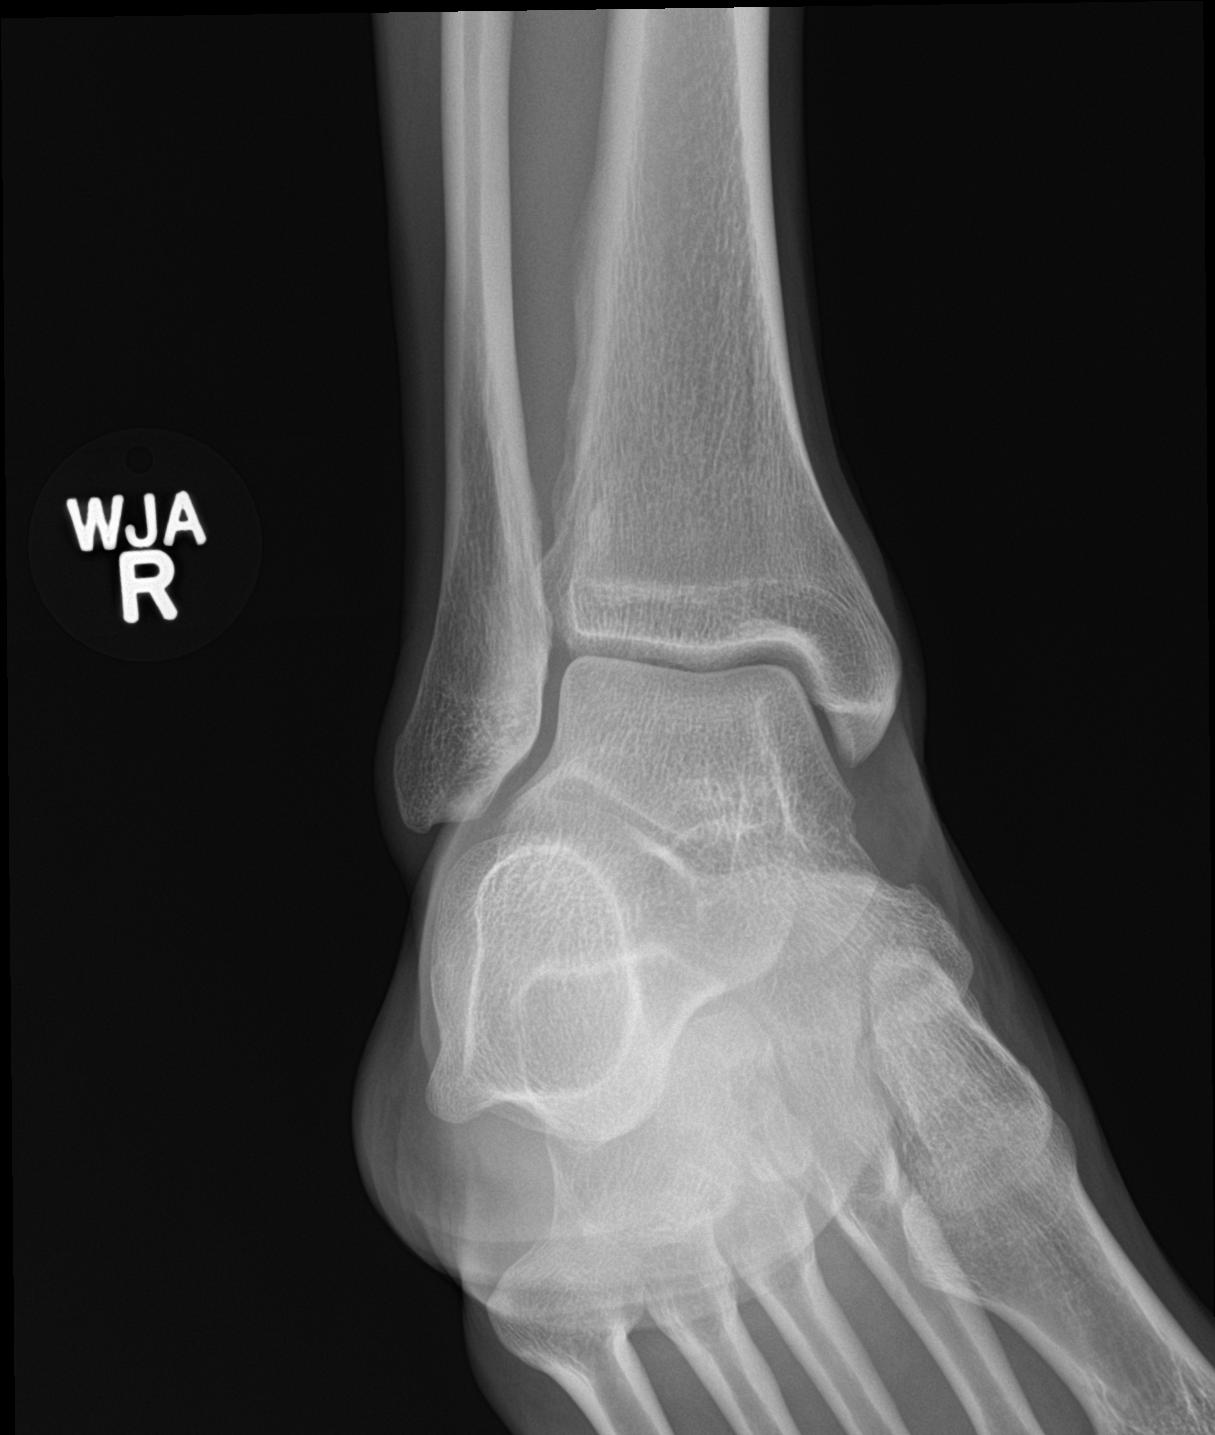

[ankle lat]
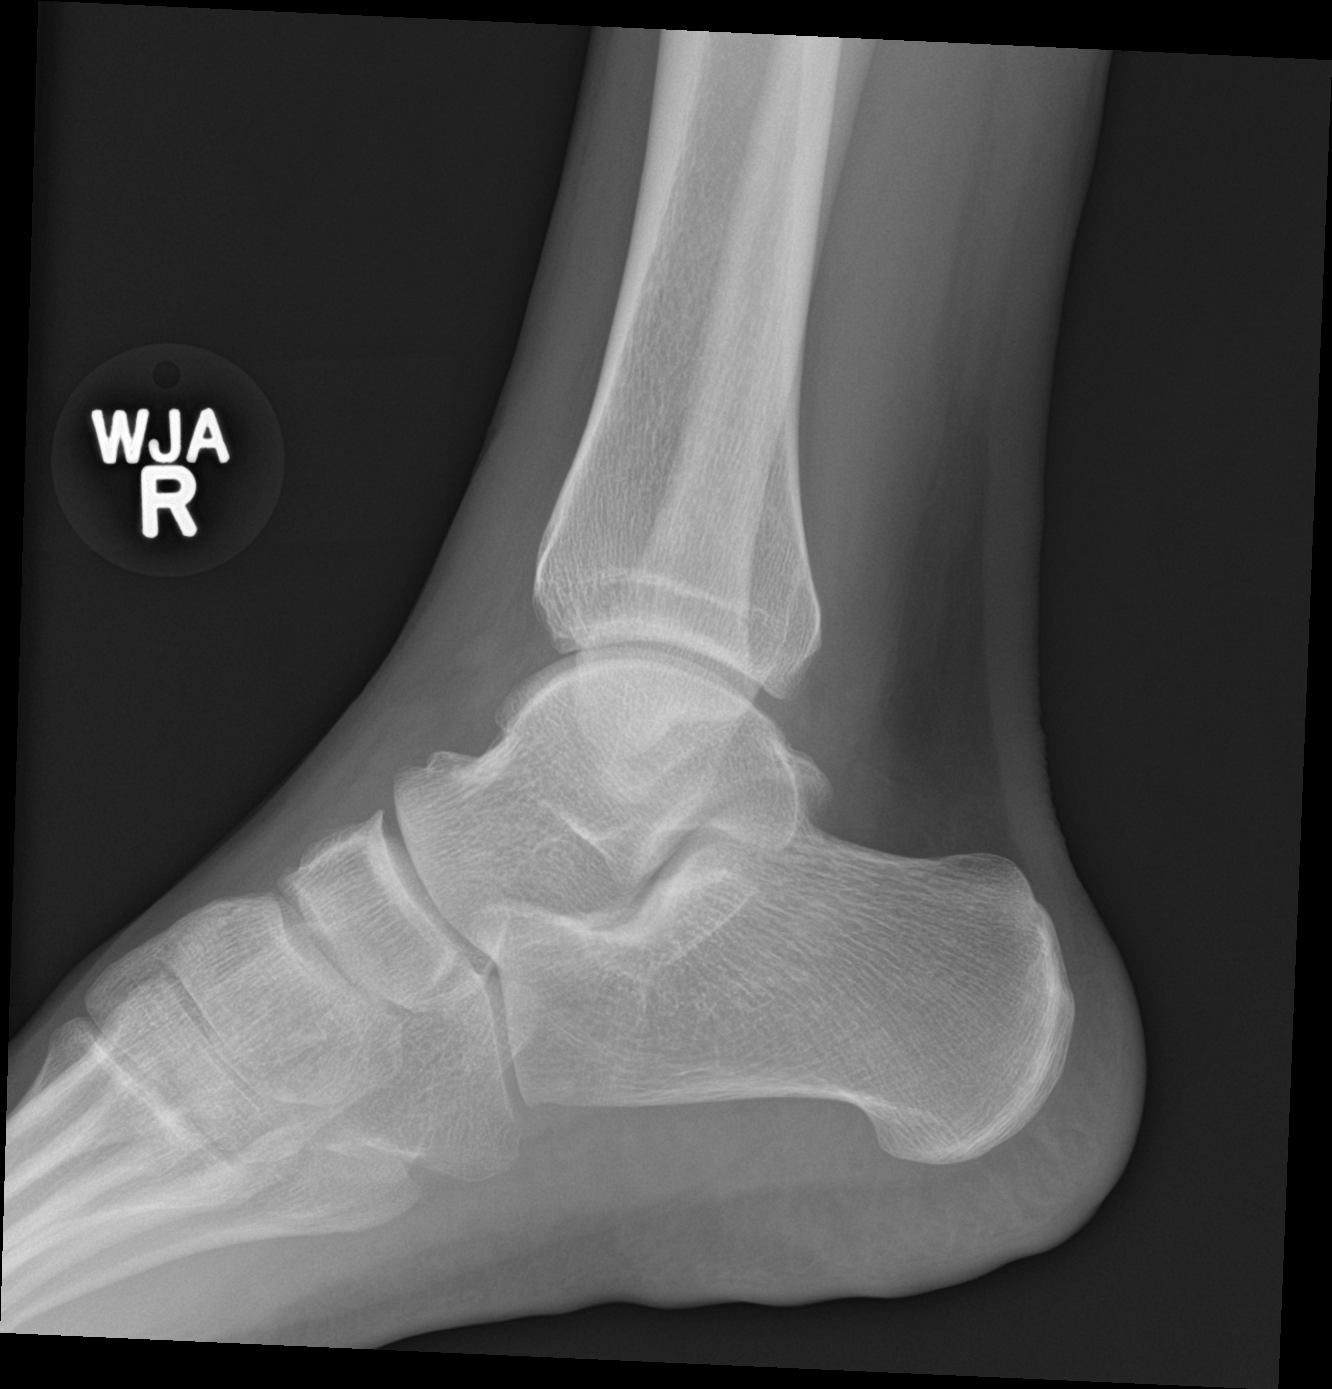

[3 of 3 positions shown; findings below may reference images not displayed]

FINDINGS: There is no evidence of fracture, dislocation, or joint effusion.
There is no evidence of arthropathy or other focal bone abnormality.
Soft tissues are unremarkable.
IMPRESSION: Negative right ankle radiographs.

## 2016-05-22 ENCOUNTER — Encounter (HOSPITAL_COMMUNITY): Payer: Self-pay

## 2016-05-22 ENCOUNTER — Emergency Department (HOSPITAL_COMMUNITY)
Admission: EM | Admit: 2016-05-22 | Discharge: 2016-05-22 | Disposition: A | Discharge status: Home / Self Care | Payer: Self-pay | Attending: Emergency Medicine | Admitting: Emergency Medicine

## 2016-05-22 DIAGNOSIS — R1013 Epigastric pain: Secondary | ICD-10-CM

## 2016-05-22 DIAGNOSIS — R1033 Periumbilical pain: Secondary | ICD-10-CM | POA: Insufficient documentation

## 2016-05-22 DIAGNOSIS — F17210 Nicotine dependence, cigarettes, uncomplicated: Secondary | ICD-10-CM | POA: Insufficient documentation

## 2016-05-22 DIAGNOSIS — J45909 Unspecified asthma, uncomplicated: Secondary | ICD-10-CM | POA: Insufficient documentation

## 2016-05-22 LAB — COMPREHENSIVE METABOLIC PANEL
ALT: 18 U/L (ref 17–63)
AST: 20 U/L (ref 15–41)
Albumin: 4.1 g/dL (ref 3.5–5.0)
Alkaline Phosphatase: 52 U/L (ref 38–126)
Anion gap: 3 — ABNORMAL LOW (ref 5–15)
BUN: 8 mg/dL (ref 6–20)
CO2: 31 mmol/L (ref 22–32)
Calcium: 9.2 mg/dL (ref 8.9–10.3)
Chloride: 108 mmol/L (ref 101–111)
Creatinine, Ser: 1.05 mg/dL (ref 0.61–1.24)
GFR calc Af Amer: >60 mL/min (ref >60)
GFR calc non Af Amer: >60 mL/min (ref >60)
Glucose, Bld: 95 mg/dL (ref 65–99)
Potassium: 4 mmol/L (ref 3.5–5.1)
Sodium: 142 mmol/L (ref 135–145)
Total Bilirubin: 0.9 mg/dL (ref 0.3–1.2)
Total Protein: 7 g/dL (ref 6.5–8.1)

## 2016-05-22 LAB — CBC
HEMATOCRIT: 45.2 % (ref 39.0–52.0)
HEMOGLOBIN: 15.1 g/dL (ref 13.0–17.0)
MCH: 29.8 pg (ref 26.0–34.0)
MCHC: 33.4 g/dL (ref 30.0–36.0)
MCV: 89.3 fL (ref 78.0–100.0)
Platelets: 177 10*3/uL (ref 150–400)
RBC: 5.06 MIL/uL (ref 4.22–5.81)
RDW: 13.1 % (ref 11.5–15.5)
WBC: 4.4 10*3/uL (ref 4.0–10.5)

## 2016-05-22 LAB — URINALYSIS, ROUTINE W REFLEX MICROSCOPIC
BILIRUBIN URINE: NEGATIVE
GLUCOSE, UA: NEGATIVE mg/dL
HGB URINE DIPSTICK: NEGATIVE
Ketones, ur: NEGATIVE mg/dL
Leukocytes, UA: NEGATIVE
Nitrite: NEGATIVE
PH: 8.5 — AB (ref 5.0–8.0)
Protein, ur: 30 mg/dL — AB
SPECIFIC GRAVITY, URINE: 1.025 (ref 1.005–1.030)

## 2016-05-22 LAB — URINE MICROSCOPIC-ADD ON

## 2016-05-22 LAB — LIPASE, BLOOD: Lipase: 42 U/L (ref 11–51)

## 2016-05-22 MED ORDER — GI COCKTAIL ~~LOC~~
30.0000 mL | Freq: Once | ORAL | Status: AC
  Administered 2016-05-22: 30 mL via ORAL
  Filled 2016-05-22: qty 30

## 2016-05-22 MED ORDER — OMEPRAZOLE 20 MG PO CPDR: 20.0000 mg | DELAYED_RELEASE_CAPSULE | Freq: Two times a day (BID) | ORAL | 0 refills | Status: AC

## 2016-05-22 MED ORDER — RANITIDINE HCL 150 MG PO TABS: 150.0000 mg | ORAL_TABLET | Freq: Two times a day (BID) | ORAL | 0 refills | Status: AC

## 2016-05-22 NOTE — ED Provider Notes (Signed)
[facility]-EMERGENCY DEPT Provider Note   CSN: [id number] Arrival date & time: 05/22/16  1030  First Provider Contact:  None       History   Chief Complaint Chief Complaint  Patient presents with  . Abdominal Pain    HPI Edward Ayers is a 25 y.o. male.  HPI   25 year old male with hx of asthma presenting with abd pain.  Pt report having intermittent mid abdominal pain for the past 2-3 weeks worsen in the past several days.  Describe pain as burning sensation, mild to moderate, lasting minutes to hours and have been waxing and waning.  Pain is associated after eating.  He admits to eating late at night due to his work schedule.  He report worsening pain earlier today but it has since subsided.  He endorse feeling nauseated and did have occasional NBNB vomit, last vomit was earlier today.  He denies fever, chills, sore throat, cp, sob, productive cough, dysuria, back pain, hematuria, bowel/bladder problem.  No prior abd surgery.  No penile/scrotal pain or discharge.   Past Medical History:  Diagnosis Date  . Asthma     There are no active problems to display for this patient.   History reviewed. No pertinent surgical history.     Home Medications    Prior to Admission medications   Medication Sig Start Date End Date Taking? Authorizing Provider  acetaminophen (TYLENOL) 500 MG tablet Take 1 tablet (500 mg total) by mouth every 6 (six) hours as needed. Patient taking differently: Take 1,000 mg by mouth every 6 (six) hours as needed for mild pain.  01/31/14   Kaitlyn Szekalski, PA-C  diazepam (VALIUM) 5 MG tablet Take 1 tablet (5 mg total) by mouth 2 (two) times daily. 09/05/15   Antony Madura, PA-C  diclofenac sodium (VOLTAREN) 1 % GEL Apply 2 g topically 4 (four) times daily. Rub into your right shoulder 09/05/15   Antony Madura, PA-C  HYDROcodone-acetaminophen (NORCO/VICODIN) 5-325 MG tablet Take 1-2 tablets by mouth every 6 (six) hours as needed. 09/05/15   Antony Madura, PA-C    ibuprofen (ADVIL,MOTRIN) 800 MG tablet Take 1 tablet (800 mg total) by mouth 3 (three) times daily. 08/30/15   Cheri Fowler, PA-C    Family History History reviewed. No pertinent family history.  Social History Social History  Substance Use Topics  . Smoking status: Current Every Day Smoker    Packs/day: 0.50    Types: Cigarettes  . Smokeless tobacco: Never Used  . Alcohol use Yes     Allergies   Cinnamon   Review of Systems Review of Systems  All other systems reviewed and are negative.    Physical Exam Updated Vital Signs BP 105/59 (BP Location: Right Arm)   Pulse (!) 51   Temp 98 F (36.7 C) (Oral)   Resp 18   Ht 5\' 7"  (1.702 m)   Wt 55.8 kg   SpO2 100%   BMI 19.26 kg/m   Physical Exam  Constitutional: He appears well-developed and well-nourished. No distress.  AAM laying in bed in no acute discomfort, non toxic  HENT:  Head: Atraumatic.  Mouth/Throat: Oropharynx is clear and moist.  Eyes: Conjunctivae are normal.  Neck: Normal range of motion. Neck supple.  Cardiovascular: Normal rate and regular rhythm.   Pulmonary/Chest: Effort normal and breath sounds normal.  Abdominal: Soft. He exhibits no distension. There is tenderness (mild periumbilical and epigastric tenderness without guarding or rebound tenderness.  no pain at McBurney's point, negative  Murphy sign. ). No hernia.  Neurological: He is alert.  Skin: No rash noted.  Psychiatric: He has a normal mood and affect.     ED Treatments / Results  Labs (all labs ordered are listed, but only abnormal results are displayed) Labs Reviewed  COMPREHENSIVE METABOLIC PANEL - Abnormal; Notable for the following:       Result Value   Anion gap 3 (*)    All other components within normal limits  URINALYSIS, ROUTINE W REFLEX MICROSCOPIC (NOT AT [facility]) - Abnormal; Notable for the following:    pH 8.5 (*)    Protein, ur 30 (*)    All other components within normal limits  URINE MICROSCOPIC-ADD ON - Abnormal;  Notable for the following:    Squamous Epithelial / LPF 0-5 (*)    Bacteria, UA RARE (*)    All other components within normal limits  LIPASE, BLOOD  CBC    EKG  EKG Interpretation None       Radiology No results found.  Procedures Procedures (including critical care time)  Medications Ordered in ED Medications - No data to display   Initial Impression / Assessment and Plan / ED Course  I have reviewed the triage vital signs and the nursing notes.  Pertinent labs & imaging results that were available during my care of the patient were reviewed by me and considered in my medical decision making (see chart for details).  Clinical Course    BP 103/65 (BP Location: Left Arm)   Pulse 60   Temp 98 F (36.7 C) (Oral)   Resp 17   Ht  (1.702 m)   Wt 55.8 kg   SpO2 100%   BMI 19.26 kg/m    Final Clinical Impressions(s) / ED Diagnoses   Final diagnoses:  Epigastric pain    New Prescriptions New Prescriptions   OMEPRAZOLE (PRILOSEC) 20 MG CAPSULE    Take 1 capsule (20 mg total) by mouth 2 (two) times daily before a meal.   RANITIDINE (ZANTAC) 150 MG TABLET    Take 1 tablet (150 mg total) by mouth 2 (two) times daily.   3:47 PM Pt with intermittent mid abdominal pain.  Pain is minimal at this time.  Pain is suggestive of gastritis due to his eating habit.  No fever, no RLQ tenderness and labs are reassuring.  Doubt acute emergent abdominal pathology such as appy or biliary disease.  GI cocktail given.    4:38 PM Pt felt better after receiving GI cocktail.  Stable for discharge.  Return precaution discusseed.  D/c with PPI and H2 blocker.  Recommend diet changes.    Fayrene Helper, PA-C 05/22/16 1642    Rolland Porter, MD 06/02/16 [phone]

## 2016-05-22 NOTE — ED Triage Notes (Signed)
Pt c/o abdominal pain around umbilicus and burning to lower middle abd, for several days.

## 2017-05-10 ENCOUNTER — Emergency Department (HOSPITAL_COMMUNITY)
Admission: EM | Admit: 2017-05-10 | Discharge: 2017-05-10 | Disposition: A | Discharge status: Home / Self Care | Payer: Self-pay | Attending: Emergency Medicine | Admitting: Emergency Medicine

## 2017-05-10 ENCOUNTER — Encounter (HOSPITAL_COMMUNITY): Payer: Self-pay

## 2017-05-10 DIAGNOSIS — K029 Dental caries, unspecified: Secondary | ICD-10-CM | POA: Insufficient documentation

## 2017-05-10 DIAGNOSIS — J45909 Unspecified asthma, uncomplicated: Secondary | ICD-10-CM | POA: Insufficient documentation

## 2017-05-10 DIAGNOSIS — F17210 Nicotine dependence, cigarettes, uncomplicated: Secondary | ICD-10-CM | POA: Insufficient documentation

## 2017-05-10 DIAGNOSIS — Z79899 Other long term (current) drug therapy: Secondary | ICD-10-CM | POA: Insufficient documentation

## 2017-05-10 MED ORDER — PENICILLIN V POTASSIUM 500 MG PO TABS: 500.0000 mg | ORAL_TABLET | Freq: Three times a day (TID) | ORAL | 0 refills | Status: AC

## 2017-05-10 MED ORDER — IBUPROFEN 800 MG PO TABS
800.0000 mg | ORAL_TABLET | Freq: Once | ORAL | Status: AC
  Administered 2017-05-10: 800 mg via ORAL
  Filled 2017-05-10: qty 1

## 2017-05-10 MED ORDER — IBUPROFEN 600 MG PO TABS: 600.0000 mg | ORAL_TABLET | Freq: Four times a day (QID) | ORAL | 0 refills | Status: AC | PRN

## 2017-05-10 NOTE — ED Triage Notes (Signed)
Patient complains of weeks of left upper tooth ache. Now has increased pain to left side of face, NAD

## 2017-05-10 NOTE — ED Provider Notes (Signed)
[facility]-EMERGENCY DEPT Provider Note   CSN: [id number] Arrival date & time: 05/10/17  1330     History   Chief Complaint No chief complaint on file.   HPI Edward Ayers is a 26 y.o. male.  HPI   26 year old male presenting complaining of toothache. Patient states for the past month he has had progressive worsening throbbing dental pain involving now both of his upper and lower jaw on the left side. Pain is worse with chewing with him opening his mouth. He described pain now is severe keeping him up at night. He has tried over-the-counter medication including ibuprofen, Tylenol, Orajel with minimal improvement. He has not been seen by a dentist due to lack of insurance. He denies having fever, trouble swallowing, neck pain, or rash. He denies any recent injury. He is a smoker.  Past Medical History:  Diagnosis Date  . Asthma     There are no active problems to display for this patient.   History reviewed. No pertinent surgical history.     Home Medications    Prior to Admission medications   Medication Sig Start Date End Date Taking? Authorizing Provider  acetaminophen (TYLENOL) 500 MG tablet Take 1 tablet (500 mg total) by mouth every 6 (six) hours as needed. Patient taking differently: Take 1,000 mg by mouth every 6 (six) hours as needed for mild pain.  01/31/14   Szekalski, Kaitlyn, PA-C  diazepam (VALIUM) 5 MG tablet Take 1 tablet (5 mg total) by mouth 2 (two) times daily. 09/05/15   Antony Madura, PA-C  diclofenac sodium (VOLTAREN) 1 % GEL Apply 2 g topically 4 (four) times daily. Rub into your right shoulder 09/05/15   Antony Madura, PA-C  HYDROcodone-acetaminophen (NORCO/VICODIN) 5-325 MG tablet Take 1-2 tablets by mouth every 6 (six) hours as needed. 09/05/15   Antony Madura, PA-C  ibuprofen (ADVIL,MOTRIN) 800 MG tablet Take 1 tablet (800 mg total) by mouth 3 (three) times daily. 08/30/15   Cheri Fowler, PA-C  omeprazole (PRILOSEC) 20 MG capsule Take 1 capsule (20 mg  total) by mouth 2 (two) times daily before a meal. 05/22/16   Fayrene Helper, PA-C  ranitidine (ZANTAC) 150 MG tablet Take 1 tablet (150 mg total) by mouth 2 (two) times daily. 05/22/16   Fayrene Helper, PA-C    Family History No family history on file.  Social History Social History  Substance Use Topics  . Smoking status: Current Every Day Smoker    Packs/day: 0.50    Types: Cigarettes  . Smokeless tobacco: Never Used  . Alcohol use Yes     Allergies   Cinnamon   Review of Systems Review of Systems  Constitutional: Negative for fever.  HENT: Positive for dental problem and ear pain. Negative for sore throat.   Neurological: Positive for headaches.     Physical Exam Updated Vital Signs BP 109/84 (BP Location: Left Arm)   Pulse (!) 104   Temp 98.6 F (37 C) (Oral)   Resp 18   Ht 5\' 7"  (1.702 m)   Wt 68 kg (150 lb)   SpO2 96%   BMI 23.49 kg/m   Physical Exam  Constitutional: He appears well-developed and well-nourished. No distress.  HENT:  Head: Atraumatic.  Ears: Normal TMs bilaterally Mouth: Dental decay noted to tooth #16 with tenderness upon palpation. Otherwise normal dentition throughout. No gingival erythema, no obvious abscess amenable for drainage, no trismus  Eyes: Conjunctivae are normal.  Neck: Neck supple.  No nuchal rigidity  Lymphadenopathy:  He has no cervical adenopathy.  Neurological: He is alert.  Skin: No rash noted.  Psychiatric: He has a normal mood and affect.  Nursing note and vitals reviewed.    ED Treatments / Results  Labs (all labs ordered are listed, but only abnormal results are displayed) Labs Reviewed - No data to display  EKG  EKG Interpretation None       Radiology No results found.  Procedures Procedures (including critical care time)  Medications Ordered in ED Medications - No data to display   Initial Impression / Assessment and Plan / ED Course  I have reviewed the triage vital signs and the nursing  notes.  Pertinent labs & imaging results that were available during my care of the patient were reviewed by me and considered in my medical decision making (see chart for details).     BP 109/84 (BP Location: Left Arm)   Pulse (!) 104   Temp 98.6 F (37 C) (Oral)   Resp 18   Ht 5\' 7"  (1.702 m)   Wt 68 kg (150 lb)   SpO2 96%   BMI 23.49 kg/m    Final Clinical Impressions(s) / ED Diagnoses   Final diagnoses:  Pain due to dental caries    New Prescriptions New Prescriptions   IBUPROFEN (ADVIL,MOTRIN) 600 MG TABLET    Take 1 tablet (600 mg total) by mouth every 6 (six) hours as needed.   PENICILLIN V POTASSIUM (VEETID) 500 MG TABLET    Take 1 tablet (500 mg total) by mouth 3 (three) times daily.   3:05 PM Patient with dentalgia.  No abscess requiring immediate incision and drainage.  Exam not concerning for Ludwig's angina or pharyngeal abscess.  Will treat with ibuprofen/penicillin and dental referral. Pt instructed to follow-up with dentist.  Discussed return precautions. Pt safe for discharge.    Fayrene Helperran, Malik Paar, PA-C 05/10/17 1506    Arby BarrettePfeiffer, Marcy, MD 05/10/17 [phone]

## 2017-05-10 NOTE — ED Notes (Signed)
Pt c/o left upper dental pain, causes "the whole side of my face to hurt". Onset several weeks ago.

## 2017-05-12 ENCOUNTER — Emergency Department (HOSPITAL_COMMUNITY): Admission: EM | Admit: 2017-05-12 | Discharge: 2017-05-12 | Discharge status: Absent without leave | Payer: Self-pay

## 2017-05-12 NOTE — ED Notes (Signed)
Called pt X 2 no answer
